# Patient Record
Sex: Female | Born: 1950 | Race: White | Hispanic: No | Marital: Single | Smoking: Never smoker
Health system: Southern US, Community
[De-identification: ages and names within clinical notes are randomized; demographics above are authoritative.]

---

## 2011-10-30 ENCOUNTER — Other Ambulatory Visit (HOSPITAL_COMMUNITY): Payer: Self-pay | Admitting: Family Medicine

## 2011-10-30 ENCOUNTER — Ambulatory Visit (HOSPITAL_COMMUNITY)
Admission: RE | Admit: 2011-10-30 | Discharge: 2011-10-30 | Disposition: A | Discharge status: Home / Self Care | Payer: Medicare Other | Source: Ambulatory Visit | Attending: Family Medicine | Admitting: Family Medicine

## 2011-10-30 DIAGNOSIS — M542 Cervicalgia: Secondary | ICD-10-CM | POA: Insufficient documentation

## 2011-11-04 ENCOUNTER — Other Ambulatory Visit (HOSPITAL_COMMUNITY): Payer: Self-pay | Admitting: Family Medicine

## 2011-11-04 DIAGNOSIS — M542 Cervicalgia: Secondary | ICD-10-CM

## 2011-11-06 ENCOUNTER — Other Ambulatory Visit (HOSPITAL_COMMUNITY): Payer: Medicare Other

## 2012-01-11 ENCOUNTER — Other Ambulatory Visit (HOSPITAL_COMMUNITY): Payer: Self-pay | Admitting: Family Medicine

## 2012-01-11 DIAGNOSIS — R52 Pain, unspecified: Secondary | ICD-10-CM

## 2012-01-13 ENCOUNTER — Ambulatory Visit (HOSPITAL_COMMUNITY)
Admission: RE | Admit: 2012-01-13 | Discharge: 2012-01-13 | Disposition: A | Discharge status: Home / Self Care | Payer: Medicare Other | Source: Ambulatory Visit | Attending: Family Medicine | Admitting: Family Medicine

## 2012-01-13 DIAGNOSIS — R52 Pain, unspecified: Secondary | ICD-10-CM

## 2012-01-13 DIAGNOSIS — R933 Abnormal findings on diagnostic imaging of other parts of digestive tract: Secondary | ICD-10-CM | POA: Insufficient documentation

## 2012-01-13 DIAGNOSIS — R110 Nausea: Secondary | ICD-10-CM | POA: Insufficient documentation

## 2012-01-13 DIAGNOSIS — R1013 Epigastric pain: Secondary | ICD-10-CM | POA: Insufficient documentation

## 2012-03-03 ENCOUNTER — Other Ambulatory Visit (HOSPITAL_COMMUNITY): Payer: Self-pay | Admitting: Family Medicine

## 2012-03-03 DIAGNOSIS — Z139 Encounter for screening, unspecified: Secondary | ICD-10-CM

## 2012-03-10 ENCOUNTER — Ambulatory Visit (HOSPITAL_COMMUNITY)
Admission: RE | Admit: 2012-03-10 | Discharge: 2012-03-10 | Disposition: A | Discharge status: Home / Self Care | Payer: Medicare Other | Source: Ambulatory Visit | Attending: Family Medicine | Admitting: Family Medicine

## 2012-03-10 DIAGNOSIS — Z139 Encounter for screening, unspecified: Secondary | ICD-10-CM

## 2012-03-10 DIAGNOSIS — Z1231 Encounter for screening mammogram for malignant neoplasm of breast: Secondary | ICD-10-CM | POA: Insufficient documentation

## 2012-04-07 ENCOUNTER — Other Ambulatory Visit (HOSPITAL_COMMUNITY): Payer: Self-pay | Admitting: Family Medicine

## 2012-04-07 ENCOUNTER — Ambulatory Visit (HOSPITAL_COMMUNITY)
Admission: RE | Admit: 2012-04-07 | Discharge: 2012-04-07 | Disposition: A | Discharge status: Home / Self Care | Payer: Medicare Other | Source: Ambulatory Visit | Attending: Family Medicine | Admitting: Family Medicine

## 2012-04-07 DIAGNOSIS — Z87891 Personal history of nicotine dependence: Secondary | ICD-10-CM

## 2012-04-07 DIAGNOSIS — F17200 Nicotine dependence, unspecified, uncomplicated: Secondary | ICD-10-CM

## 2013-03-13 ENCOUNTER — Other Ambulatory Visit (HOSPITAL_COMMUNITY): Payer: Self-pay | Admitting: Family Medicine

## 2013-03-13 DIAGNOSIS — R55 Syncope and collapse: Secondary | ICD-10-CM

## 2013-03-13 DIAGNOSIS — T148XXA Other injury of unspecified body region, initial encounter: Secondary | ICD-10-CM

## 2013-03-14 ENCOUNTER — Other Ambulatory Visit (HOSPITAL_COMMUNITY): Payer: Self-pay | Admitting: Family Medicine

## 2013-03-14 ENCOUNTER — Ambulatory Visit (HOSPITAL_COMMUNITY)
Admission: RE | Admit: 2013-03-14 | Discharge: 2013-03-14 | Disposition: A | Discharge status: Home / Self Care | Payer: Medicare Other | Source: Ambulatory Visit | Attending: Family Medicine | Admitting: Family Medicine

## 2013-03-14 DIAGNOSIS — R55 Syncope and collapse: Secondary | ICD-10-CM

## 2013-03-14 DIAGNOSIS — S0510XA Contusion of eyeball and orbital tissues, unspecified eye, initial encounter: Secondary | ICD-10-CM | POA: Insufficient documentation

## 2013-03-14 DIAGNOSIS — M5030 Other cervical disc degeneration, unspecified cervical region: Secondary | ICD-10-CM | POA: Insufficient documentation

## 2013-03-14 DIAGNOSIS — W1809XA Striking against other object with subsequent fall, initial encounter: Secondary | ICD-10-CM | POA: Insufficient documentation

## 2013-03-14 DIAGNOSIS — T148XXA Other injury of unspecified body region, initial encounter: Secondary | ICD-10-CM

## 2013-03-14 DIAGNOSIS — M542 Cervicalgia: Secondary | ICD-10-CM | POA: Insufficient documentation

## 2013-03-20 ENCOUNTER — Encounter (HOSPITAL_COMMUNITY): Payer: Self-pay | Admitting: Emergency Medicine

## 2013-03-20 ENCOUNTER — Emergency Department (HOSPITAL_COMMUNITY): Payer: Medicare Other

## 2013-03-20 ENCOUNTER — Emergency Department (HOSPITAL_COMMUNITY)
Admission: EM | Admit: 2013-03-20 | Discharge: 2013-03-20 | Disposition: A | Discharge status: Home / Self Care | Payer: Medicare Other | Attending: Emergency Medicine | Admitting: Emergency Medicine

## 2013-03-20 DIAGNOSIS — R51 Headache: Secondary | ICD-10-CM | POA: Insufficient documentation

## 2013-03-20 DIAGNOSIS — G8911 Acute pain due to trauma: Secondary | ICD-10-CM | POA: Insufficient documentation

## 2013-03-20 DIAGNOSIS — M542 Cervicalgia: Secondary | ICD-10-CM | POA: Insufficient documentation

## 2013-03-20 NOTE — ED Notes (Signed)
Pt states she fell about a week ago and was evaluated here. Complain headache and neck pain.

## 2013-03-20 NOTE — ED Provider Notes (Signed)
CSN: [id number]     Arrival date & time 03/20/13  0745 History  This chart was scribed for Benny Lennert, MD by Bennett Scrape, ED Scribe. This patient was seen in room [facility] and the patient's care was started at 8:17 AM.   Chief Complaint  Patient presents with  . Headache    Patient is a 62 y.o. female presenting with headaches. The history is provided by the patient. No language interpreter was used.  Headache Pain location:  Generalized Quality: pressure. Radiates to:  R neck and L neck Pain severity now: mild. Onset quality:  Gradual Timing:  Constant Progression:  Waxing and waning Chronicity:  New Associated symptoms: no abdominal pain, no back pain, no congestion, no cough, no diarrhea, no fatigue, no photophobia, no seizures, no sinus pressure and no vomiting     HPI Comments: Katelyn Gentry is a 62 y.o. female who presents to the Emergency Department complaining of gradual onset, constant HA for the past week that gradually worsened  while in bed last night. She describes the pain as pressure-like that radiates into her posterior neck with associated neck stiffness and pain with ROM. She reports that currently she is experiencing mild pain. She admits that she fell about one week ago and was evaluated by her PMD for the same. She states that she felt "gas in my stomach" and was having abdominal cramps, tried to get to the bathroom and passed out. She states that she hit her head on the edge of her bed when she fell. She admits to having prior episodes of the same that she has f/u with her PMD for. She had a negative CT scan done through [facility] of her head at the time. Pt denies having blood work done after the fall stating that she had a complete physical one week prior with normal blood work results. She denies being on any anticoagulants currently. She states that she has been taking ibuprofen and Tramadol with mild temporary improvement. She denies any other symptoms  currently.    History reviewed. No pertinent past medical history. History reviewed. No pertinent past surgical history. No family history on file. History  Substance Use Topics  . Smoking status: Never Smoker   . Smokeless tobacco: Not on file  . Alcohol Use: No   No OB history provided.  Review of Systems  Constitutional: Negative for chills, appetite change and fatigue.  HENT: Negative for congestion, ear discharge and sinus pressure.   Eyes: Negative for photophobia and discharge.  Respiratory: Negative for cough.   Cardiovascular: Negative for chest pain.  Gastrointestinal: Negative for vomiting, abdominal pain and diarrhea.  Genitourinary: Negative for frequency and hematuria.  Musculoskeletal: Negative for back pain.  Skin: Negative for rash.  Neurological: Positive for headaches. Negative for seizures.  Psychiatric/Behavioral: Negative for hallucinations.    Allergies  Review of patient's allergies indicates no known allergies.  Home Medications  No current outpatient prescriptions on file.  Triage Vitals: BP 123/78  Pulse 69  Temp(Src) 98.7 F (37.1 C) (Oral)  Resp 18  SpO2 95%  Physical Exam  Nursing note and vitals reviewed. Constitutional: She is oriented to person, place, and time. She appears well-developed and well-nourished.  HENT:  Head: Normocephalic.  Ecchymosis around the right orbit  Eyes: Conjunctivae and EOM are normal. No scleral icterus.  Neck: Neck supple. No thyromegaly present.  Cardiovascular: Normal rate and regular rhythm.  Exam reveals no gallop and no friction rub.   No murmur  heard. Pulmonary/Chest: Effort normal and breath sounds normal. No stridor. She has no wheezes. She has no rales. She exhibits no tenderness.  Abdominal: She exhibits no distension. There is no tenderness. There is no rebound.  Musculoskeletal: Normal range of motion. She exhibits no edema.  Lymphadenopathy:    She has no cervical adenopathy.   Neurological: She is alert and oriented to person, place, and time. She exhibits normal muscle tone. Coordination normal.  Skin: Skin is warm and dry. No rash noted. No erythema.  Psychiatric: She has a normal mood and affect. Her behavior is normal.    ED Course  Procedures (including critical care time)  DIAGNOSTIC STUDIES: Oxygen Saturation is 95% on room air, adequate by my interpretation.    COORDINATION OF CARE: 8:23 AM-Discussed treatment plan which includes repeat CT head scan with pt at bedside and pt agreed to plan.   Labs Review Labs Reviewed - No data to display Imaging Review Ct Head Wo Contrast  03/20/2013   CLINICAL DATA:  Head and neck pain after fall.  EXAM: CT HEAD WITHOUT CONTRAST  CT CERVICAL SPINE WITHOUT CONTRAST  TECHNIQUE: Multidetector CT imaging of the head and cervical spine was performed following the standard protocol without intravenous contrast. Multiplanar CT image reconstructions of the cervical spine were also generated.  COMPARISON:  CT scan of head of March 14, 2013.  FINDINGS: CT HEAD FINDINGS  Bony calvarium appears intact. No mass effect or midline shift is noted. Ventricular size is within normal limits. There is no evidence of mass lesion, hemorrhage or acute infarction.  CT CERVICAL SPINE FINDINGS  No fracture or spondylolisthesis is noted. Moderate degenerative disc disease is noted at C5-6 with anterior and posterior osteophyte formation. Posterior facet joints appear normal.  IMPRESSION: No gross intracranial abnormality seen.  Moderate degenerative disc disease is noted at C5-6. No acute abnormality seen in the cervical spine.   Electronically Signed   By: Roque Lias M.D.   On: 03/20/2013 09:28   Ct Cervical Spine Wo Contrast  03/20/2013   CLINICAL DATA:  Head and neck pain after fall.  EXAM: CT HEAD WITHOUT CONTRAST  CT CERVICAL SPINE WITHOUT CONTRAST  TECHNIQUE: Multidetector CT imaging of the head and cervical spine was performed following  the standard protocol without intravenous contrast. Multiplanar CT image reconstructions of the cervical spine were also generated.  COMPARISON:  CT scan of head of March 14, 2013.  FINDINGS: CT HEAD FINDINGS  Bony calvarium appears intact. No mass effect or midline shift is noted. Ventricular size is within normal limits. There is no evidence of mass lesion, hemorrhage or acute infarction.  CT CERVICAL SPINE FINDINGS  No fracture or spondylolisthesis is noted. Moderate degenerative disc disease is noted at C5-6 with anterior and posterior osteophyte formation. Posterior facet joints appear normal.  IMPRESSION: No gross intracranial abnormality seen.  Moderate degenerative disc disease is noted at C5-6. No acute abnormality seen in the cervical spine.   Electronically Signed   By: Roque Lias M.D.   On: 03/20/2013 09:28    EKG Interpretation   None       MDM  No diagnosis found.   The chart was scribed for me under my direct supervision.  I personally performed the history, physical, and medical decision making and all procedures in the evaluation of this patient.Benny Lennert, MD 03/20/13 1430

## 2013-12-15 ENCOUNTER — Other Ambulatory Visit (HOSPITAL_COMMUNITY): Payer: Self-pay | Admitting: Family Medicine

## 2013-12-15 ENCOUNTER — Ambulatory Visit (HOSPITAL_COMMUNITY)
Admission: RE | Admit: 2013-12-15 | Discharge: 2013-12-15 | Disposition: A | Discharge status: Home / Self Care | Payer: Medicare HMO | Source: Ambulatory Visit | Attending: Family Medicine | Admitting: Family Medicine

## 2013-12-15 DIAGNOSIS — R079 Chest pain, unspecified: Secondary | ICD-10-CM | POA: Insufficient documentation

## 2013-12-15 DIAGNOSIS — R0789 Other chest pain: Secondary | ICD-10-CM

## 2014-01-22 ENCOUNTER — Other Ambulatory Visit (HOSPITAL_COMMUNITY): Payer: Self-pay | Admitting: Family Medicine

## 2014-01-22 DIAGNOSIS — Z1231 Encounter for screening mammogram for malignant neoplasm of breast: Secondary | ICD-10-CM

## 2014-01-29 ENCOUNTER — Ambulatory Visit (HOSPITAL_COMMUNITY): Payer: Medicare HMO

## 2014-07-03 IMAGING — CT CT HEAD W/O CM
4 of 5 series · 16 of 47 positions shown, 17 images · non-contrast
Comparison: CT scan of head March 14, 2013.

CLINICAL DATA: Head and neck pain after fall.

EXAM:
CT HEAD WITHOUT CONTRAST
CT CERVICAL SPINE WITHOUT CONTRAST
TECHNIQUE: Multidetector CT imaging of the head and cervical spine was
performed following the standard protocol without intravenous
contrast. Multiplanar CT image reconstructions of the cervical spine
were also generated.

[Series 2: headseq 4.8 h37s · axial · 0.40mm/px · z∈[+253,+301]mm · 2 of 30 slices shown, 3 images]
[im 10/30  brain]
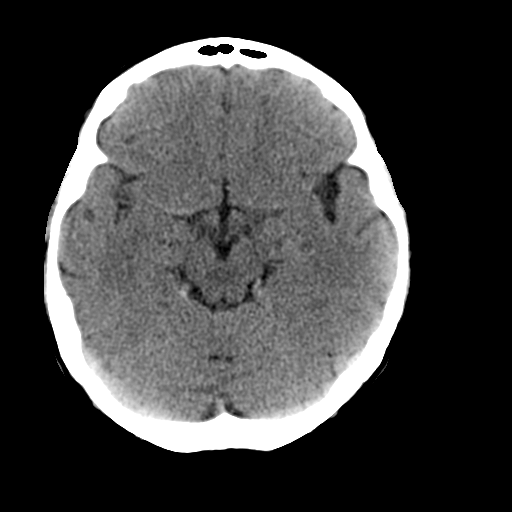
[im 10/30  bone]
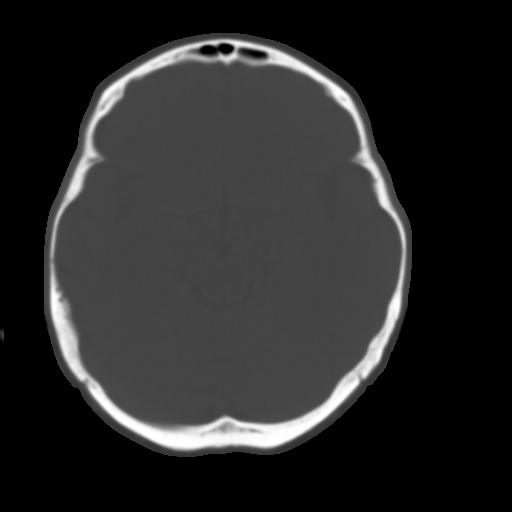
[im 20/30  brain]
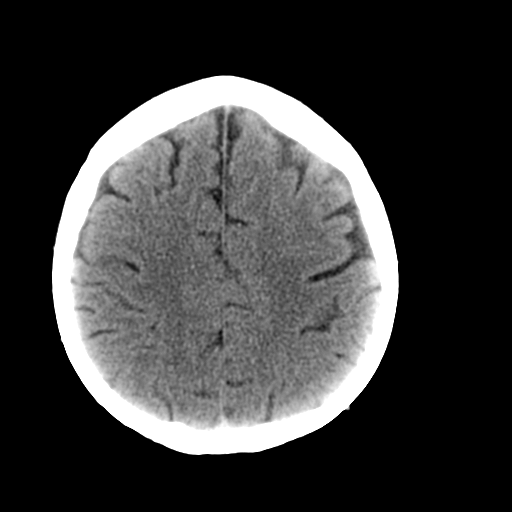

[Series 7: sagittal bone 2.0 · sagittal · 0.27mm/px · 3 of 48 slices shown]
[im 16/48  brain]
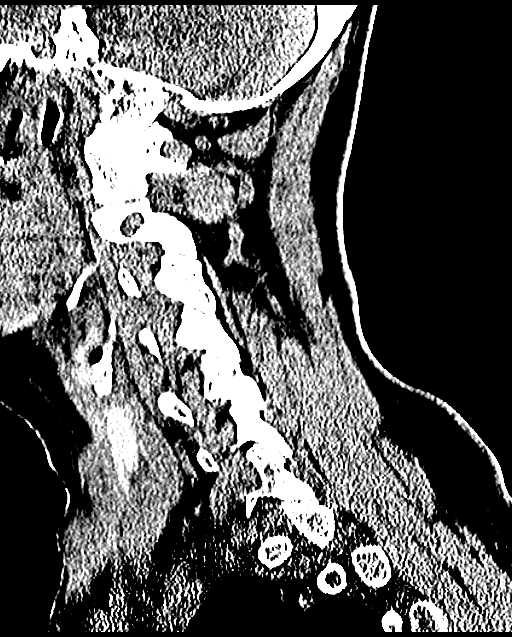
[im 24/48  brain]
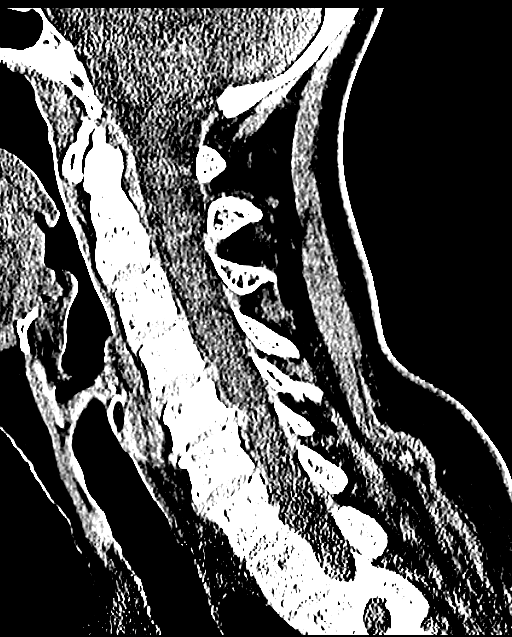
[im 32/48  brain]
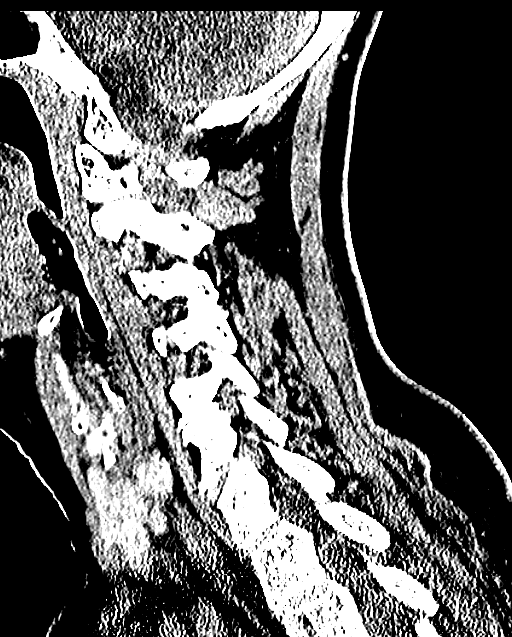

[Series 8: coronal bone 2.0 · coronal · 0.24mm/px · 3 of 53 slices shown]
[im 18/53  brain]
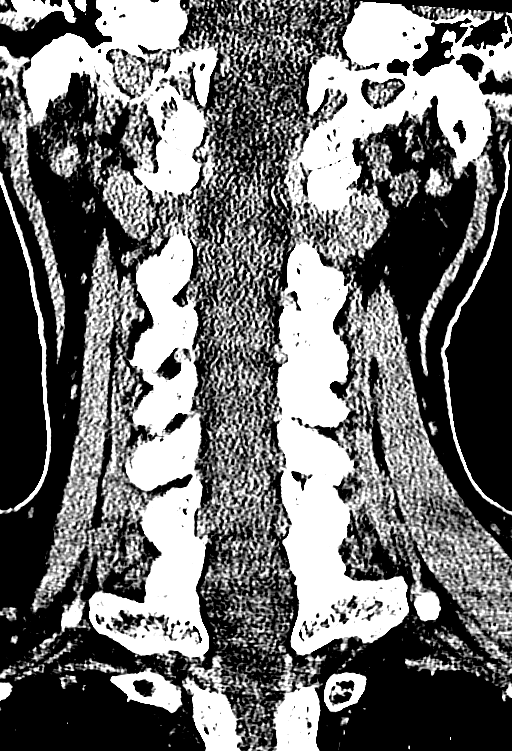
[im 24/53  brain]
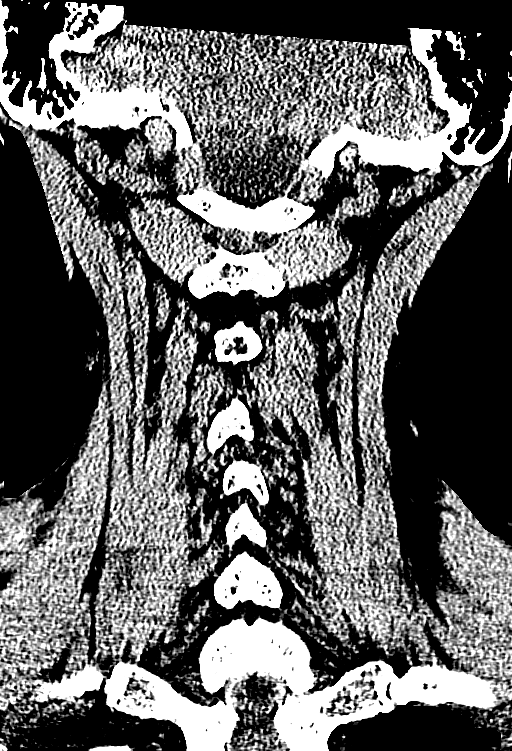
[im 29/53  brain]
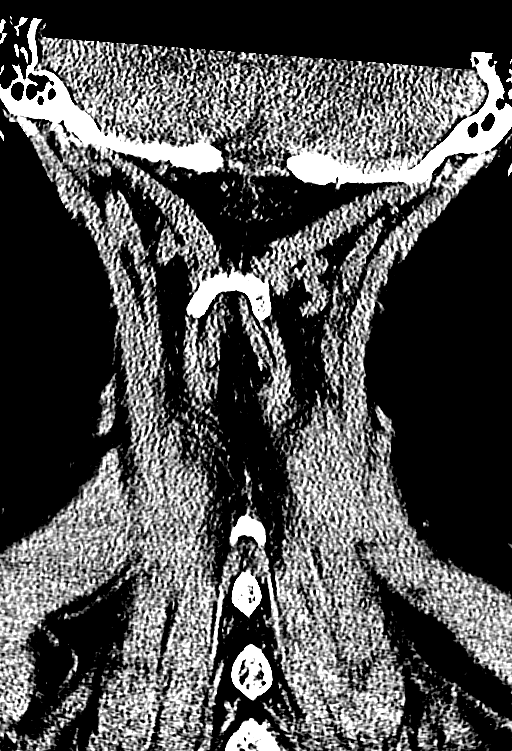

[Series 9: axial bone 2.0 · axial · 0.21mm/px · z∈[+34,+192]mm · 8 of 102 slices shown]
[im 8/102  bone]
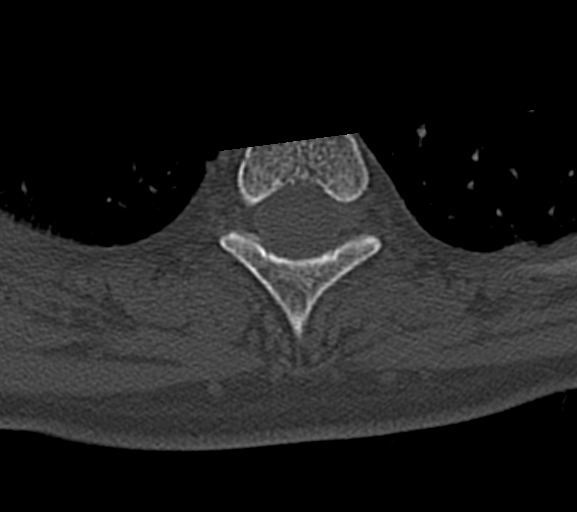
[im 24/102  bone]
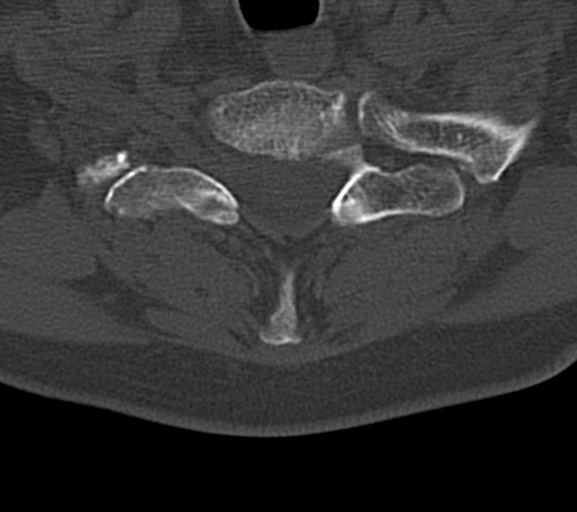
[im 32/102  bone]
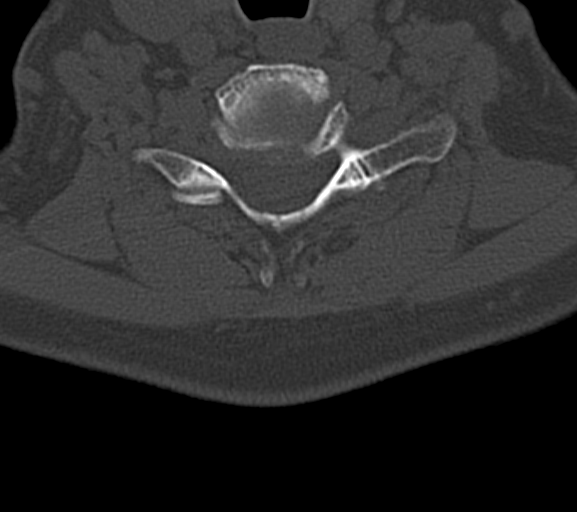
[im 47/102  bone]
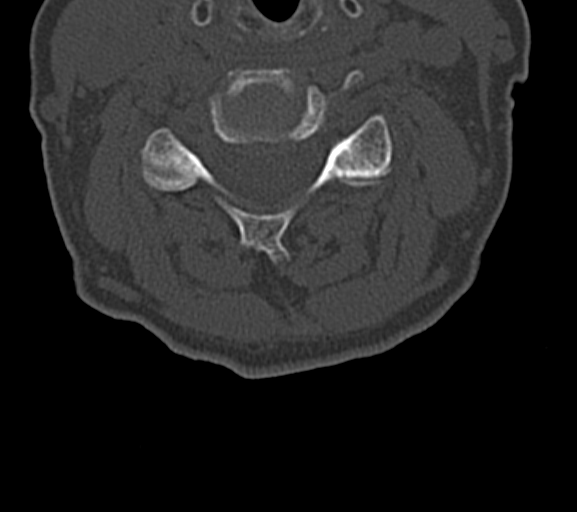
[im 55/102  bone]
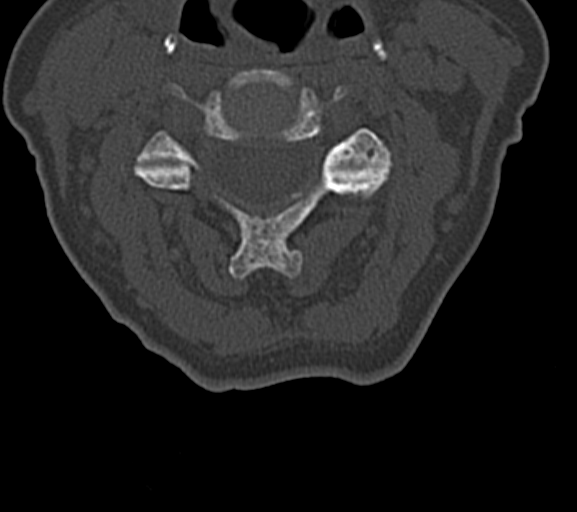
[im 70/102  bone]
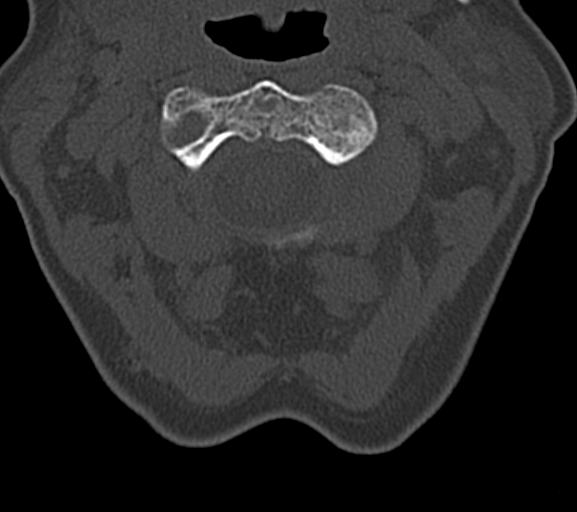
[im 78/102  bone]
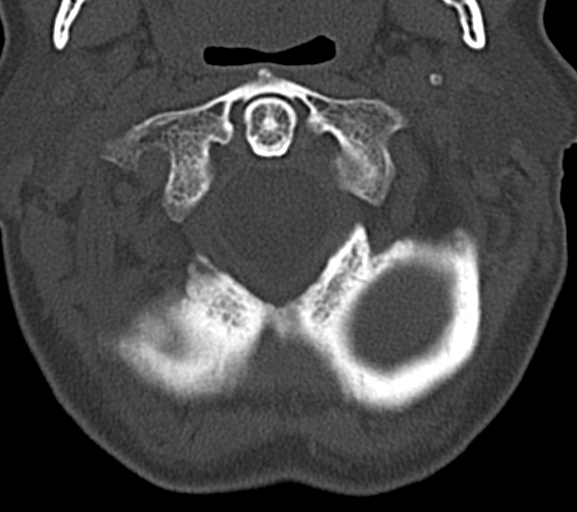
[im 94/102  bone]
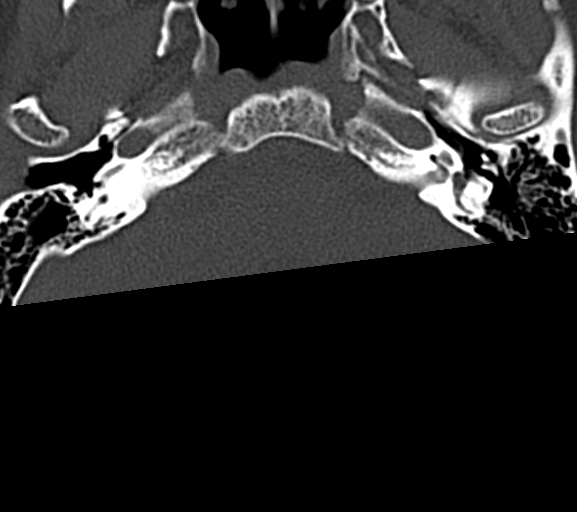

[16 of 47 positions shown; findings below may reference images not displayed]

FINDINGS: CT HEAD FINDINGS

Bony calvarium appears intact. No mass effect or midline shift is
noted. Ventricular size is within normal limits. There is no
evidence of mass lesion, hemorrhage or acute infarction.

CT CERVICAL SPINE FINDINGS

No fracture or spondylolisthesis is noted. Moderate degenerative
disc disease is noted at C5-6 with anterior and posterior osteophyte
formation. Posterior facet joints appear normal.
IMPRESSION: No gross intracranial abnormality seen.

Moderate degenerative disc disease is noted at C5-6. No acute
abnormality seen in the cervical spine.

## 2014-10-09 ENCOUNTER — Other Ambulatory Visit (HOSPITAL_COMMUNITY): Payer: Self-pay | Admitting: Family Medicine

## 2014-10-09 DIAGNOSIS — M810 Age-related osteoporosis without current pathological fracture: Secondary | ICD-10-CM

## 2014-10-15 ENCOUNTER — Ambulatory Visit (HOSPITAL_COMMUNITY)
Admission: RE | Admit: 2014-10-15 | Discharge: 2014-10-15 | Disposition: A | Discharge status: Home / Self Care | Payer: Medicare Other | Source: Ambulatory Visit | Attending: Family Medicine | Admitting: Family Medicine

## 2014-10-15 DIAGNOSIS — Z1231 Encounter for screening mammogram for malignant neoplasm of breast: Secondary | ICD-10-CM | POA: Insufficient documentation

## 2014-10-17 ENCOUNTER — Ambulatory Visit (HOSPITAL_COMMUNITY)
Admission: RE | Admit: 2014-10-17 | Discharge: 2014-10-17 | Disposition: A | Discharge status: Home / Self Care | Payer: Medicare Other | Source: Ambulatory Visit | Attending: Family Medicine | Admitting: Family Medicine

## 2014-10-17 DIAGNOSIS — M810 Age-related osteoporosis without current pathological fracture: Secondary | ICD-10-CM | POA: Insufficient documentation

## 2015-03-30 IMAGING — CR DG CHEST 2V
2 series · 2 of 2 positions shown · non-contrast
Comparison: April 07, 2012

CLINICAL DATA: Chest pain

EXAM:
CHEST  2 VIEW

[view not recorded (1 of 2)]
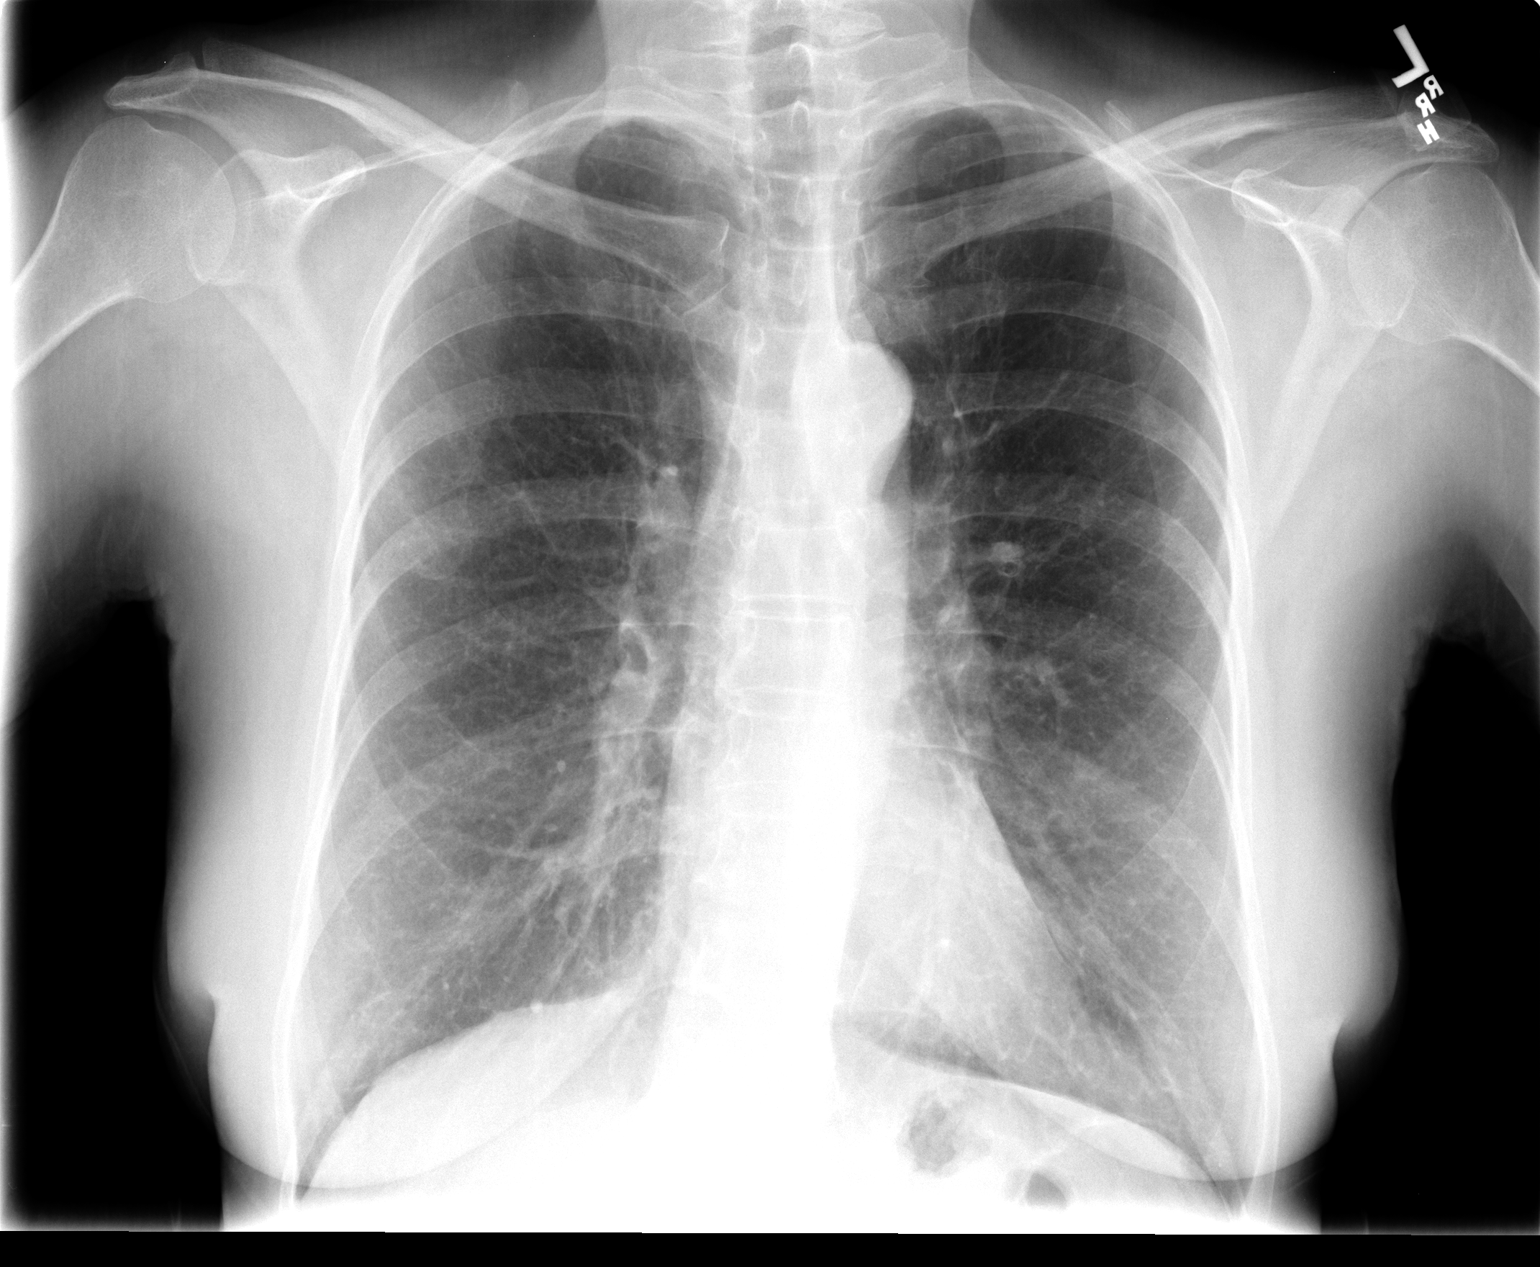

[view not recorded (2 of 2)]
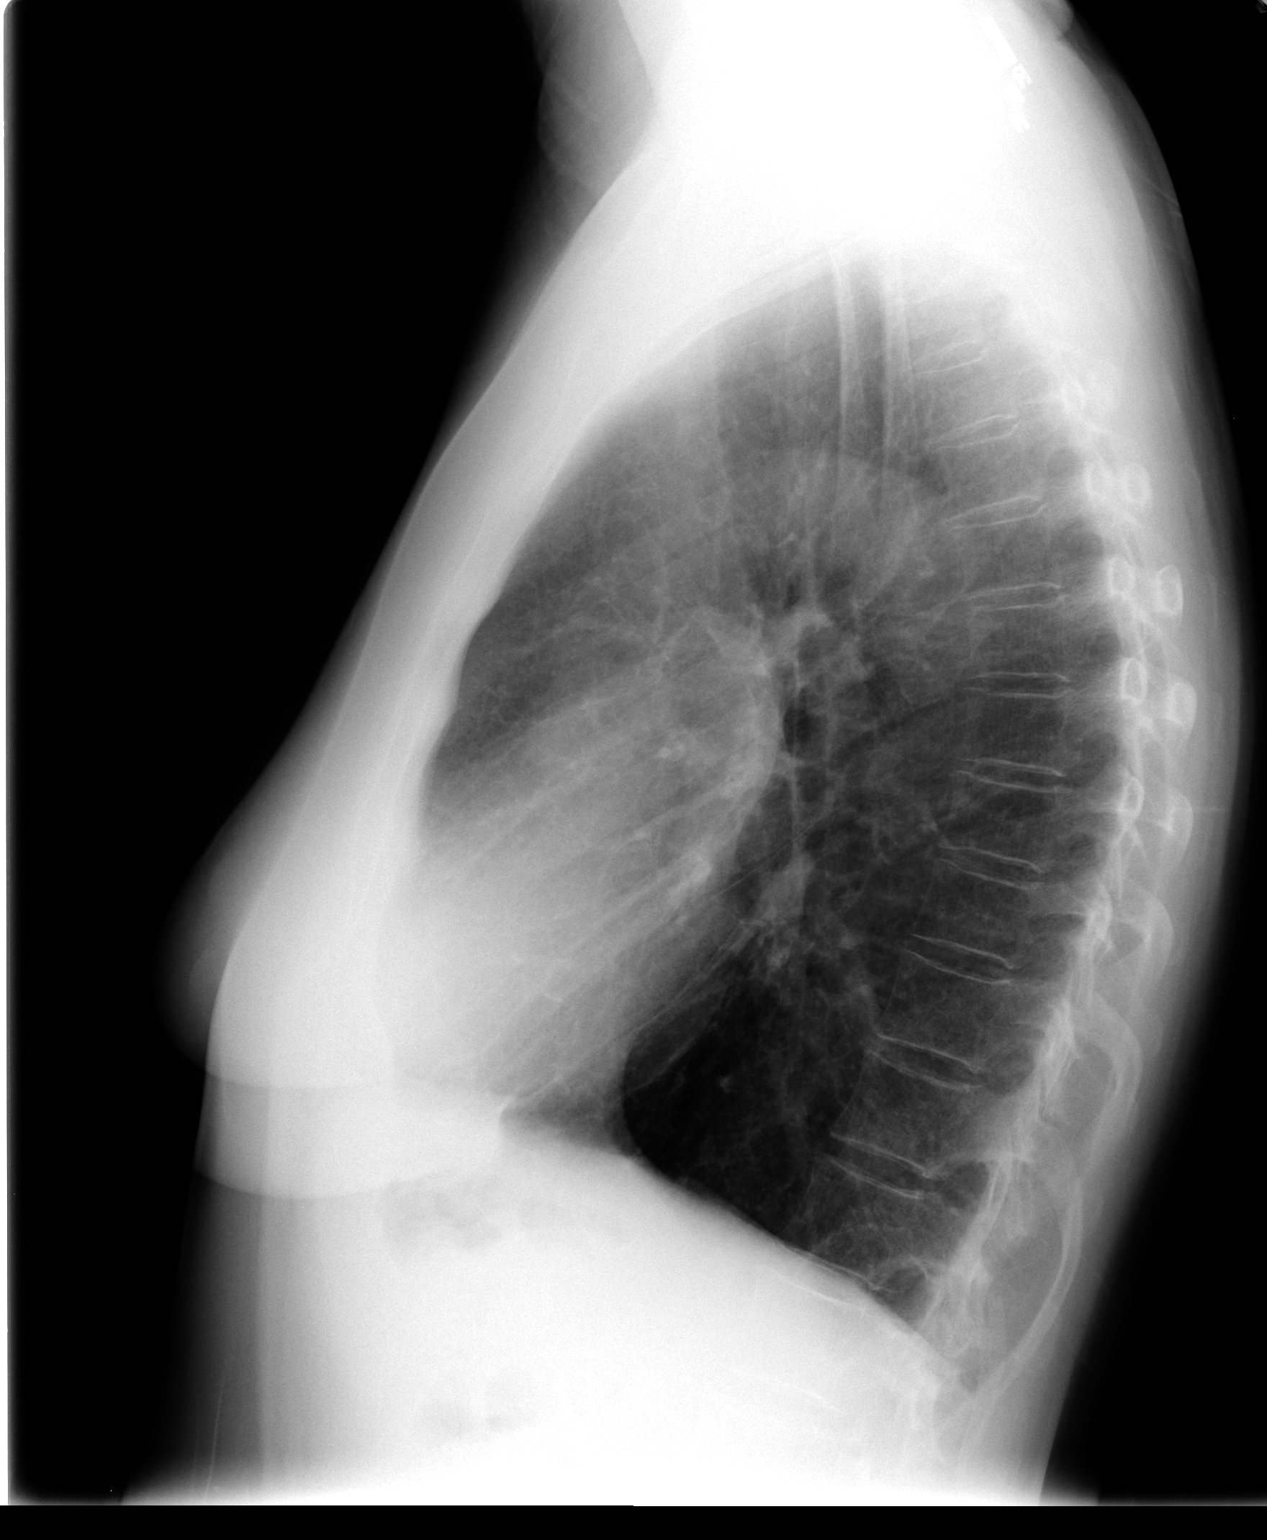

[2 of 2 positions shown; findings below may reference images not displayed]

FINDINGS: There is underlying emphysematous change. There is no edema or
consolidation. The heart size and pulmonary vascularity are within
normal limits. No adenopathy. No bone lesions.
IMPRESSION: Underlying emphysematous change.  No edema or consolidation.
